# Patient Record
Sex: Male | Born: 1985 | Marital: Single | Smoking: Current every day smoker
Health system: Southern US, Community
[De-identification: ages and names within clinical notes are randomized; demographics above are authoritative.]

## PROBLEM LIST (undated history)

## (undated) DIAGNOSIS — J40 Bronchitis, not specified as acute or chronic: Secondary | ICD-10-CM

## (undated) DIAGNOSIS — I10 Essential (primary) hypertension: Secondary | ICD-10-CM

## (undated) DIAGNOSIS — I509 Heart failure, unspecified: Secondary | ICD-10-CM

---

## 2020-04-16 ENCOUNTER — Other Ambulatory Visit: Payer: Self-pay | Admitting: Pediatrics

## 2020-04-16 DIAGNOSIS — I10 Essential (primary) hypertension: Secondary | ICD-10-CM

## 2020-04-16 DIAGNOSIS — I5022 Chronic systolic (congestive) heart failure: Secondary | ICD-10-CM

## 2020-05-18 ENCOUNTER — Ambulatory Visit
Admission: RE | Admit: 2020-05-18 | Discharge: 2020-05-18 | Disposition: A | Discharge status: Home / Self Care | Payer: Disability Insurance | Source: Ambulatory Visit | Attending: Pediatrics | Admitting: Pediatrics

## 2020-05-18 ENCOUNTER — Ambulatory Visit: Admission: RE | Admit: 2020-05-18 | Payer: Self-pay | Source: Ambulatory Visit

## 2020-05-18 ENCOUNTER — Other Ambulatory Visit: Payer: Self-pay

## 2020-05-18 DIAGNOSIS — I5022 Chronic systolic (congestive) heart failure: Secondary | ICD-10-CM | POA: Insufficient documentation

## 2020-05-18 DIAGNOSIS — I081 Rheumatic disorders of both mitral and tricuspid valves: Secondary | ICD-10-CM | POA: Insufficient documentation

## 2020-05-18 DIAGNOSIS — I10 Essential (primary) hypertension: Secondary | ICD-10-CM

## 2020-05-18 DIAGNOSIS — I110 Hypertensive heart disease with heart failure: Secondary | ICD-10-CM | POA: Diagnosis not present

## 2020-05-18 LAB — ECHOCARDIOGRAM COMPLETE
AR max vel: 2.97 cm2
AV Area VTI: 2.83 cm2
AV Area mean vel: 2.85 cm2
AV Mean grad: 4 mmHg
AV Peak grad: 6.4 mmHg
Ao pk vel: 1.26 m/s
Area-P 1/2: 5.84 cm2
Calc EF: 38.7 %
MV VTI: 3.95 cm2
S' Lateral: 4.5 cm
Single Plane A2C EF: 41.2 %
Single Plane A4C EF: 40.8 %

## 2020-05-18 NOTE — Progress Notes (Signed)
*  PRELIMINARY RESULTS* Echocardiogram 2D Echocardiogram has been performed.  Joanette Gula Winifred Bodiford 05/18/2020, 12:02 PM

## 2021-09-07 ENCOUNTER — Encounter (HOSPITAL_BASED_OUTPATIENT_CLINIC_OR_DEPARTMENT_OTHER): Payer: Self-pay | Admitting: Emergency Medicine

## 2021-09-07 ENCOUNTER — Emergency Department (HOSPITAL_BASED_OUTPATIENT_CLINIC_OR_DEPARTMENT_OTHER): Payer: Self-pay

## 2021-09-07 ENCOUNTER — Emergency Department (HOSPITAL_BASED_OUTPATIENT_CLINIC_OR_DEPARTMENT_OTHER)
Admission: EM | Admit: 2021-09-07 | Discharge: 2021-09-07 | Disposition: A | Discharge status: Home / Self Care | Payer: Self-pay | Attending: Emergency Medicine | Admitting: Emergency Medicine

## 2021-09-07 DIAGNOSIS — M79602 Pain in left arm: Secondary | ICD-10-CM | POA: Insufficient documentation

## 2021-09-07 DIAGNOSIS — R0602 Shortness of breath: Secondary | ICD-10-CM | POA: Insufficient documentation

## 2021-09-07 DIAGNOSIS — I110 Hypertensive heart disease with heart failure: Secondary | ICD-10-CM | POA: Insufficient documentation

## 2021-09-07 DIAGNOSIS — I509 Heart failure, unspecified: Secondary | ICD-10-CM | POA: Insufficient documentation

## 2021-09-07 HISTORY — DX: Essential (primary) hypertension: I10

## 2021-09-07 HISTORY — DX: Heart failure, unspecified: I50.9

## 2021-09-07 HISTORY — DX: Bronchitis, not specified as acute or chronic: J40

## 2021-09-07 LAB — HEPATIC FUNCTION PANEL
ALT: 30 U/L (ref 0–44)
AST: 28 U/L (ref 15–41)
Albumin: 4.2 g/dL (ref 3.5–5.0)
Alkaline Phosphatase: 51 U/L (ref 38–126)
Bilirubin, Direct: <0.1 mg/dL (ref 0.0–0.2)
Total Bilirubin: 0.5 mg/dL (ref 0.3–1.2)
Total Protein: 7.5 g/dL (ref 6.5–8.1)

## 2021-09-07 LAB — BASIC METABOLIC PANEL
Anion gap: 9 (ref 5–15)
BUN: 6 mg/dL (ref 6–20)
CO2: 23 mmol/L (ref 22–32)
Calcium: 9.1 mg/dL (ref 8.9–10.3)
Chloride: 107 mmol/L (ref 98–111)
Creatinine, Ser: 0.96 mg/dL (ref 0.61–1.24)
GFR, Estimated: >60 mL/min (ref >60)
Glucose, Bld: 120 mg/dL — ABNORMAL HIGH (ref 70–99)
Potassium: 3.3 mmol/L — ABNORMAL LOW (ref 3.5–5.1)
Sodium: 139 mmol/L (ref 135–145)

## 2021-09-07 LAB — CBC
HCT: 46 % (ref 39.0–52.0)
Hemoglobin: 15.8 g/dL (ref 13.0–17.0)
MCH: 29.3 pg (ref 26.0–34.0)
MCHC: 34.3 g/dL (ref 30.0–36.0)
MCV: 85.3 fL (ref 80.0–100.0)
Platelets: 367 10*3/uL (ref 150–400)
RBC: 5.39 MIL/uL (ref 4.22–5.81)
RDW: 14.6 % (ref 11.5–15.5)
WBC: 8.5 10*3/uL (ref 4.0–10.5)
nRBC: 0 % (ref 0.0–0.2)

## 2021-09-07 LAB — BRAIN NATRIURETIC PEPTIDE: B Natriuretic Peptide: 11.1 pg/mL (ref 0.0–100.0)

## 2021-09-07 LAB — TROPONIN I (HIGH SENSITIVITY): Troponin I (High Sensitivity): 16 ng/L (ref <18)

## 2021-09-07 LAB — LIPASE, BLOOD: Lipase: 40 U/L (ref 11–51)

## 2021-09-07 NOTE — ED Notes (Signed)
Call to lab to add labs to tubes already sent

## 2021-09-07 NOTE — ED Triage Notes (Signed)
Left bicep pain for about a week sometimes radiates to forearm. Occasionally has some numbness and SOB

## 2021-09-07 NOTE — ED Provider Notes (Signed)
[facility] Provider Note   CSN: [id number] Arrival date & time: 09/07/21  2116     History  Chief Complaint  Patient presents with   Arm Pain    [facility] is a 36 y.o. male.  Patient here with some chest pain, shortness of breath, left arm pain.  History of cardiomyopathy/heart failure, hypertension.  Felt a little bit more short of breath than normal.  He has been compliant with his medications.  Denies any fevers or chills.  Denies any weakness.  Nothing has made his symptoms worse or better.  Denies any headache, neck pain, vision loss, exertional pain.  Follows with cardiology at [facility].  The history is provided by the patient.       Home Medications Prior to Admission medications   Not on File      Allergies    Patient has no known allergies.    Review of Systems   Review of Systems  Physical Exam Updated Vital Signs BP (!) 176/118 (BP Location: Right Arm)   Pulse 100   Temp 98.7 F (37.1 C) (Oral)   Resp 18   Ht 5\' 7"  (1.702 m)   Wt 136.1 kg   SpO2 98%   BMI 46.99 kg/m  Physical Exam Vitals and nursing note reviewed.  Constitutional:      General: He is not in acute distress.    Appearance: He is well-developed.  HENT:     Head: Normocephalic and atraumatic.     Nose: Nose normal.     Mouth/Throat:     Mouth: Mucous membranes are moist.  Eyes:     Extraocular Movements: Extraocular movements intact.     Conjunctiva/sclera: Conjunctivae normal.     Pupils: Pupils are equal, round, and reactive to light.  Cardiovascular:     Rate and Rhythm: Normal rate and regular rhythm.     Pulses: Normal pulses.     Heart sounds: Normal heart sounds. No murmur heard. Pulmonary:     Effort: Pulmonary effort is normal. No respiratory distress.     Breath sounds: Normal breath sounds.  Abdominal:     Palpations: Abdomen is soft.     Tenderness: There is no abdominal tenderness.  Musculoskeletal:         General: No swelling. Normal range of motion.     Cervical back: Neck supple.     Right lower leg: No edema.     Left lower leg: No edema.  Skin:    General: Skin is warm and dry.     Capillary Refill: Capillary refill takes less than 2 seconds.  Neurological:     General: No focal deficit present.     Mental Status: He is alert and oriented to person, place, and time.     Cranial Nerves: No cranial nerve deficit.     Sensory: No sensory deficit.     Motor: No weakness.     Coordination: Coordination normal.  Psychiatric:        Mood and Affect: Mood normal.     ED Results / Procedures / Treatments   Labs (all labs ordered are listed, but only abnormal results are displayed) Labs Reviewed  BASIC METABOLIC PANEL - Abnormal; Notable for the following components:      Result Value   Potassium 3.3 (*)    Glucose, Bld 120 (*)    All other components within normal limits  CBC  BRAIN NATRIURETIC PEPTIDE  HEPATIC FUNCTION  PANEL  LIPASE, BLOOD  TROPONIN I (HIGH SENSITIVITY)    EKG EKG Interpretation  Date/Time:  Tuesday September 07 2021 21:26:37 EDT Ventricular Rate:  98 PR Interval:  170 QRS Duration: 96 QT Interval:  344 QTC Calculation: 439 R Axis:   84 Text Interpretation: Normal sinus rhythm No previous ECGs available Confirmed by [facility] ([id number]) on 09/07/2021 9:28:44 PM  Radiology DG Chest 2 View  Result Date: 09/07/2021 CLINICAL DATA:  Left biceps pain, numbness and shortness of breath. EXAM: CHEST - 2 VIEW COMPARISON:  Portable chest 04/07/2021 FINDINGS: The heart size and mediastinal contours are within normal limits. Both lungs are clear. The visualized skeletal structures are unremarkable. IMPRESSION: No active cardiopulmonary disease.  Stable chest. Electronically Signed   By: Telford Nab M.D.   On: 09/07/2021 21:51    Procedures Procedures    Medications Ordered in ED Medications - No data to display  ED Course/ Medical Decision Making/ A&P                            Medical Decision Making Amount and/or Complexity of Data Reviewed Labs: ordered. Radiology: ordered.   [name] is here with shortness of breath, arm pain/chest pain.  History of cardiomyopathy.  Overall mild elevation of blood pressure but normal vitals otherwise.  Differential diagnosis possibly volume overload in setting of heart failure exacerbation versus ACS, acid reflux versus pancreatitis versus gastritis.  Will check CBC, CMP, lipase, troponin, chest x-ray, BNP.  EKG per my review and interpretation shows sinus rhythm.  No ischemic changes.  Per my review of chest x-ray no signs of volume overload or pneumonia.  Lab work showed no significant anemia, electrolyte abnormality, leukocytosis per my review and interpretation.  Furthermore troponin and BNP unremarkable.  Overall work-up is unremarkable.  Discharged in good condition.  No concern for ACS.  Had clean heart cath within the last several years.  No signs of volume overload.  Overall atypical story.  Given reassurance and recommend follow-up with his cardiology team.  This chart was dictated using voice recognition software.  Despite best efforts to proofread,  errors can occur which can change the documentation meaning.         Final Clinical Impression(s) / ED Diagnoses Final diagnoses:  Left arm pain  SOB (shortness of breath)    Rx / DC Orders ED Discharge Orders     None         Jack Sites, DO 09/07/21 2307

## 2022-06-16 ENCOUNTER — Other Ambulatory Visit (HOSPITAL_COMMUNITY): Payer: Self-pay | Admitting: Family

## 2022-06-16 DIAGNOSIS — I428 Other cardiomyopathies: Secondary | ICD-10-CM

## 2022-07-14 ENCOUNTER — Ambulatory Visit (HOSPITAL_COMMUNITY): Payer: 59 | Attending: Family

## 2022-07-14 ENCOUNTER — Encounter (HOSPITAL_COMMUNITY): Payer: Self-pay | Admitting: Family

## 2023-06-23 IMAGING — DX DG CHEST 2V
2 series · 2 of 2 positions shown · non-contrast
Comparison: Portable chest 04/07/2021

CLINICAL DATA: Left biceps pain, numbness and shortness of breath.

EXAM:
CHEST - 2 VIEW

[chest pa]
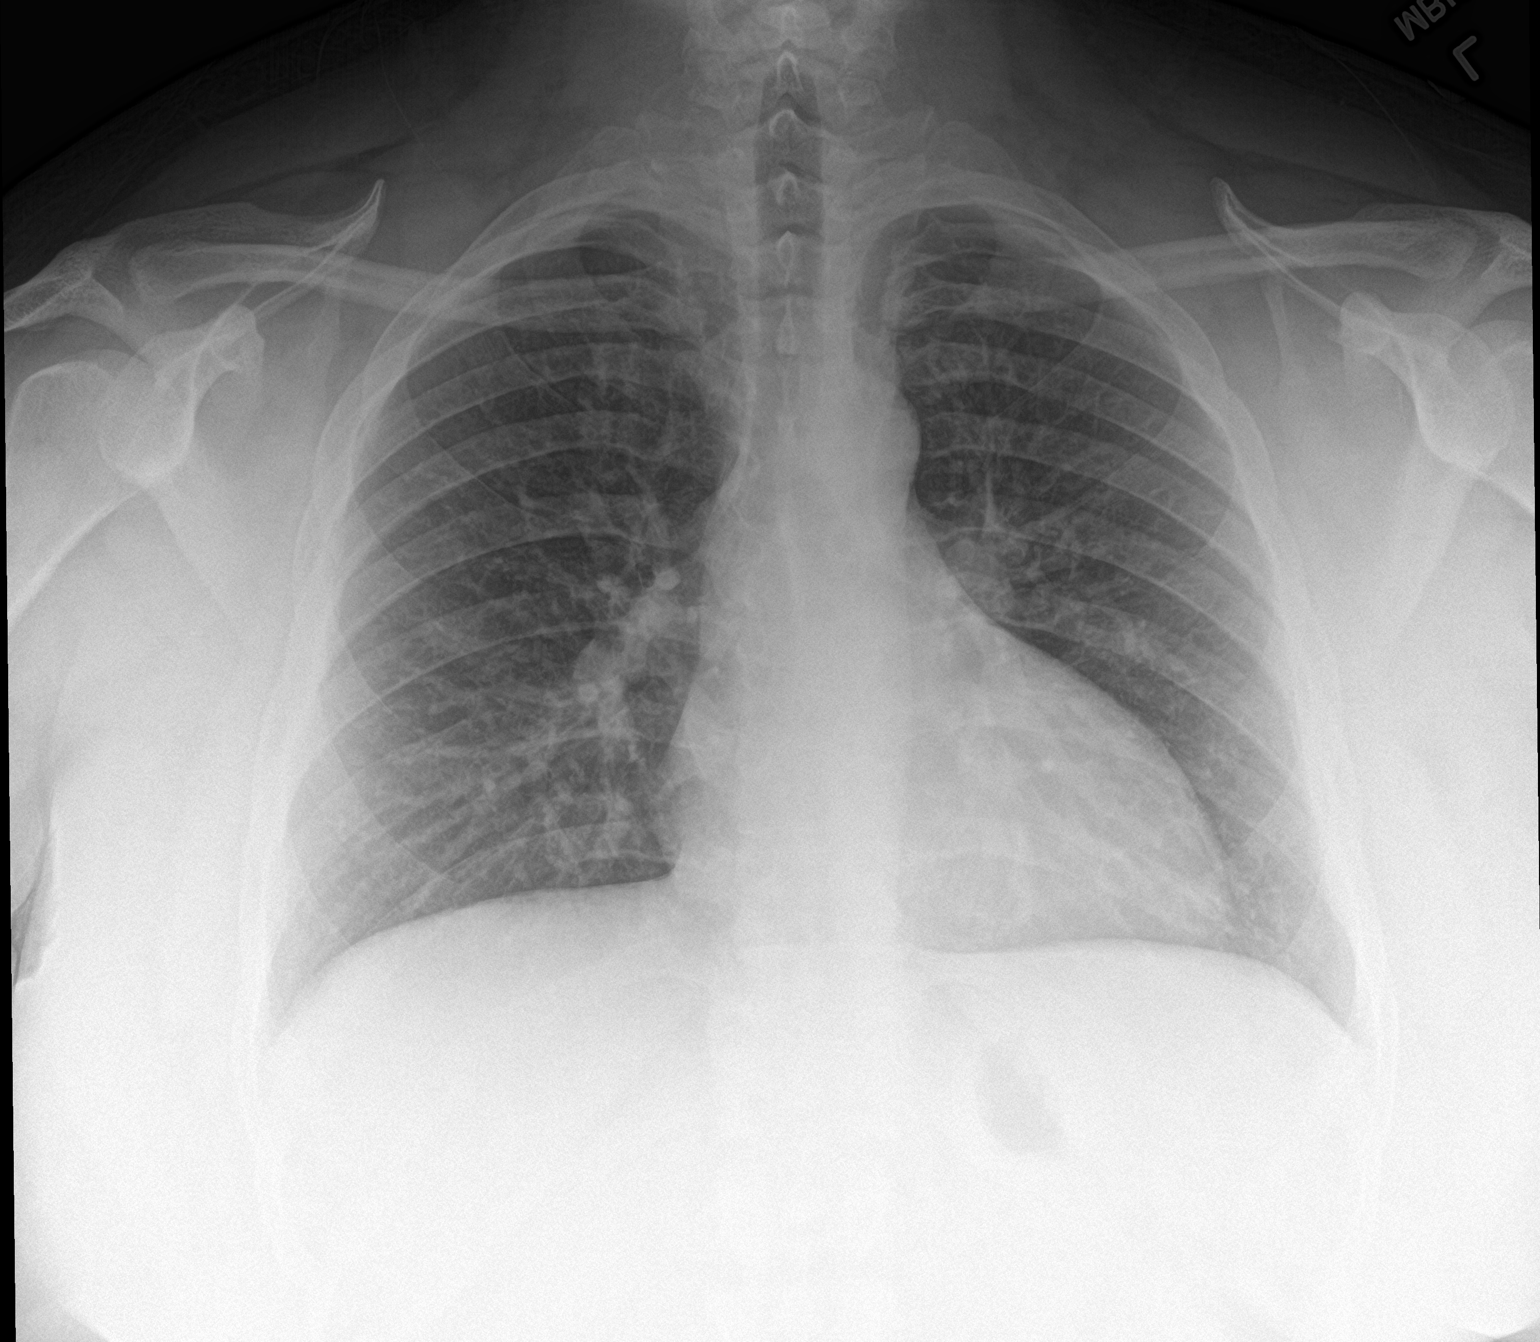

[chest lat]
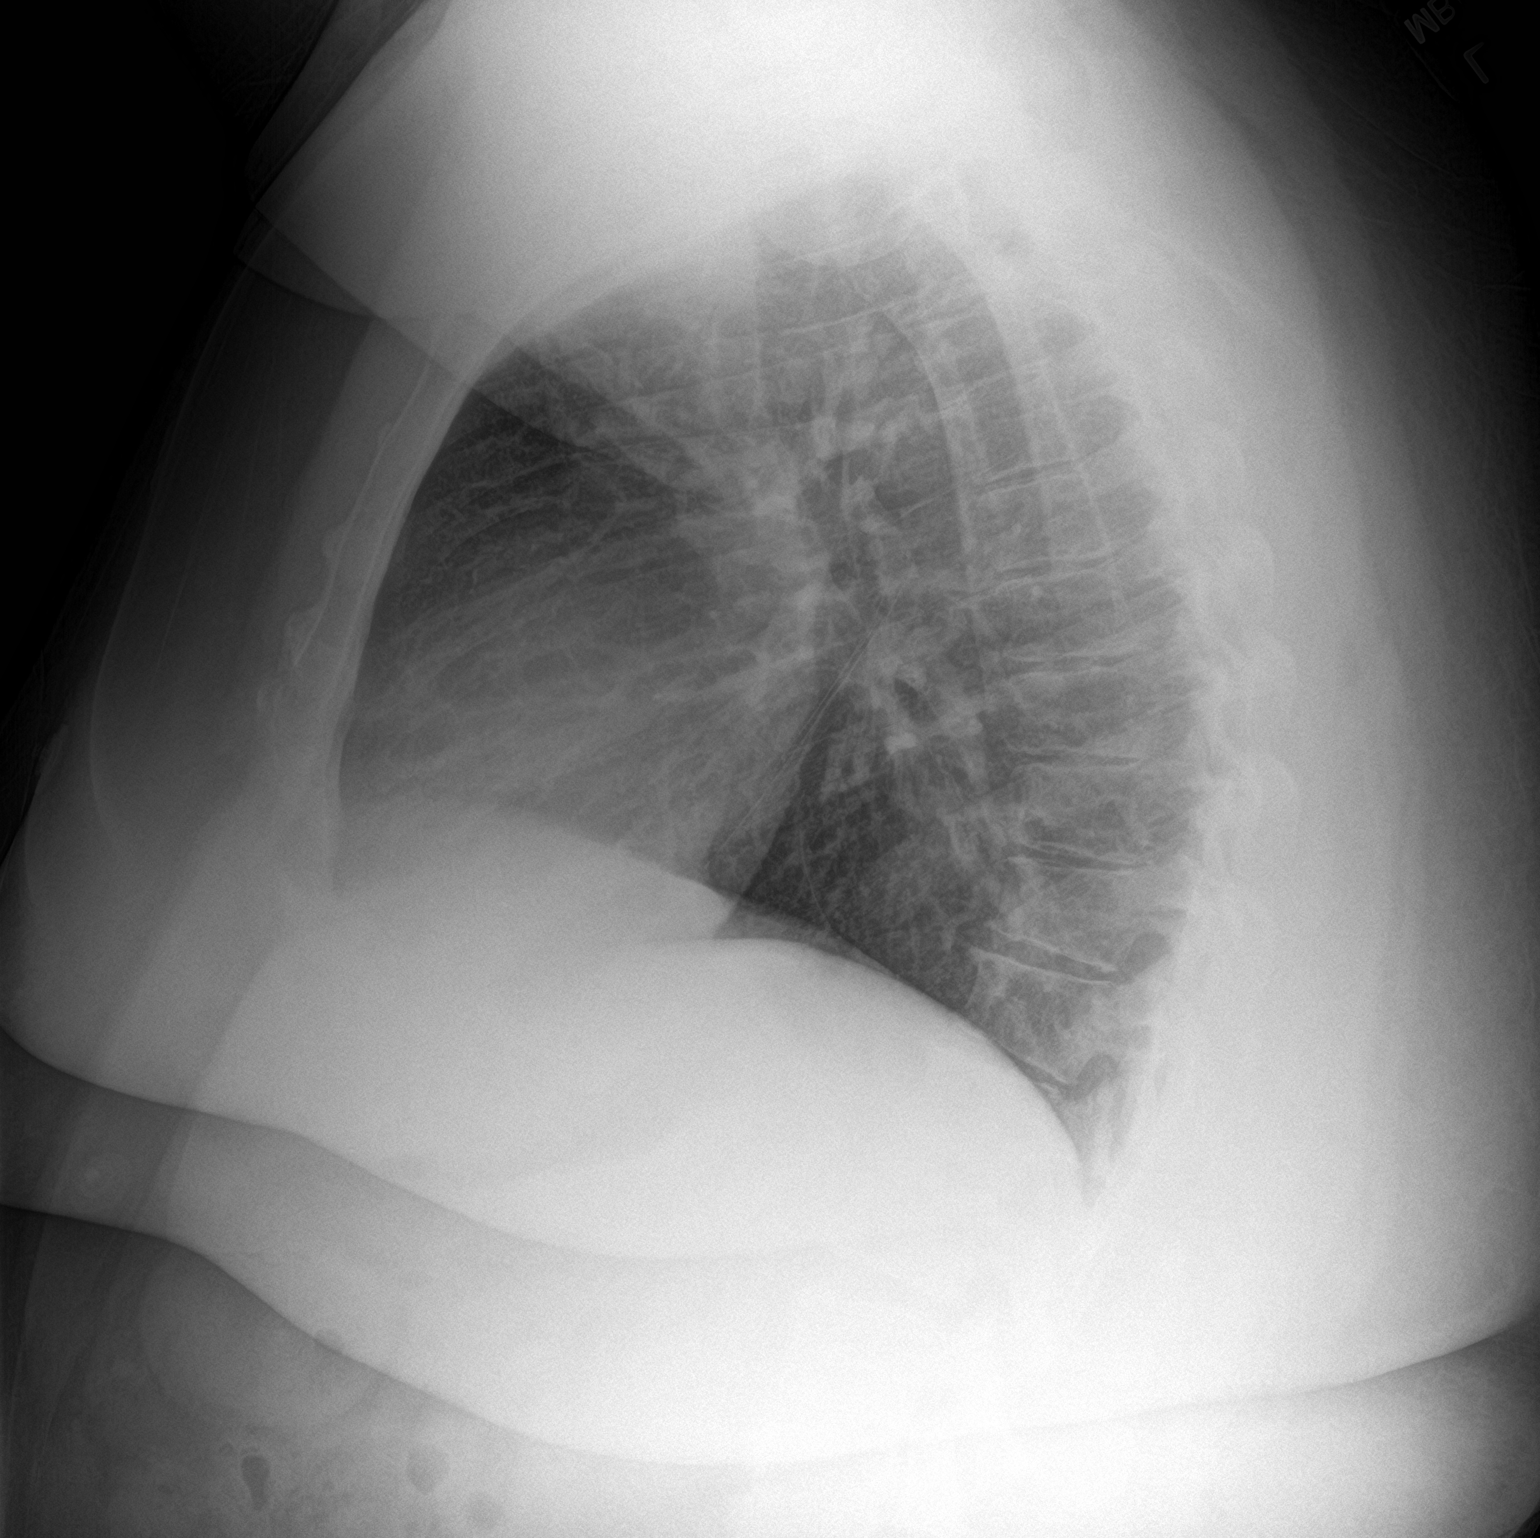

[2 of 2 positions shown; findings below may reference images not displayed]

FINDINGS: The heart size and mediastinal contours are within normal limits.
Both lungs are clear. The visualized skeletal structures are
unremarkable.
IMPRESSION: No active cardiopulmonary disease.  Stable chest.
